# Patient Record
Sex: Male | Born: 1950 | Race: White | Hispanic: No | Marital: Married | Smoking: Never smoker
Health system: Southern US, Community
[De-identification: ages and names within clinical notes are randomized; demographics above are authoritative.]

## PROBLEM LIST (undated history)

## (undated) DIAGNOSIS — I10 Essential (primary) hypertension: Secondary | ICD-10-CM

## (undated) DIAGNOSIS — N400 Enlarged prostate without lower urinary tract symptoms: Secondary | ICD-10-CM

## (undated) DIAGNOSIS — E785 Hyperlipidemia, unspecified: Secondary | ICD-10-CM

## (undated) HISTORY — PX: THYROID SURGERY: SHX805

---

## 2010-11-02 ENCOUNTER — Ambulatory Visit: Payer: Self-pay | Admitting: Gastroenterology

## 2015-08-26 ENCOUNTER — Other Ambulatory Visit: Payer: Self-pay | Admitting: Internal Medicine

## 2015-08-26 DIAGNOSIS — R221 Localized swelling, mass and lump, neck: Secondary | ICD-10-CM

## 2015-09-05 ENCOUNTER — Ambulatory Visit
Admission: RE | Admit: 2015-09-05 | Discharge: 2015-09-05 | Disposition: A | Discharge status: Home / Self Care | Payer: PPO | Source: Ambulatory Visit | Attending: Internal Medicine | Admitting: Internal Medicine

## 2015-09-05 DIAGNOSIS — R221 Localized swelling, mass and lump, neck: Secondary | ICD-10-CM | POA: Insufficient documentation

## 2015-09-05 DIAGNOSIS — I700 Atherosclerosis of aorta: Secondary | ICD-10-CM | POA: Diagnosis not present

## 2015-09-05 HISTORY — DX: Essential (primary) hypertension: I10

## 2015-09-05 MED ORDER — IOHEXOL 300 MG/ML  SOLN
75.0000 mL | Freq: Once | INTRAMUSCULAR | Status: AC | PRN
  Administered 2015-09-05: 75 mL via INTRAVENOUS

## 2015-09-24 DIAGNOSIS — Q892 Congenital malformations of other endocrine glands: Secondary | ICD-10-CM | POA: Diagnosis not present

## 2015-10-15 ENCOUNTER — Encounter
Admission: RE | Admit: 2015-10-15 | Discharge: 2015-10-15 | Disposition: A | Discharge status: Home / Self Care | Payer: PPO | Source: Ambulatory Visit | Attending: Unknown Physician Specialty | Admitting: Unknown Physician Specialty

## 2015-10-15 DIAGNOSIS — Z01810 Encounter for preprocedural cardiovascular examination: Secondary | ICD-10-CM | POA: Diagnosis not present

## 2015-10-15 DIAGNOSIS — I10 Essential (primary) hypertension: Secondary | ICD-10-CM | POA: Diagnosis not present

## 2015-10-15 DIAGNOSIS — Q892 Congenital malformations of other endocrine glands: Secondary | ICD-10-CM | POA: Diagnosis not present

## 2015-10-15 HISTORY — DX: Hyperlipidemia, unspecified: E78.5

## 2015-10-15 HISTORY — DX: Benign prostatic hyperplasia without lower urinary tract symptoms: N40.0

## 2015-10-15 NOTE — Patient Instructions (Signed)
  Your procedure is scheduled MW:[id number] 10/21/15 Report to Day Surgery. 2ND FLOOR MEDICAL MALL ENTRANCE To find out your arrival time please call [phone] between 1PM - 3PM on Monday 10/20/15.  Remember: Instructions that are not followed completely may result in serious medical risk, up to and including death, or upon the discretion of your surgeon and anesthesiologist your surgery may need to be rescheduled.    __X__ 1. Do not eat food or drink liquids after midnight. No gum chewing or hard candies.     __X__ 2. No Alcohol for 24 hours before or after surgery.   ____ 3. Bring all medications with you on the day of surgery if instructed.    ___X_ 4. Notify your doctor if there is any change in your medical condition     (cold, fever, infections).     Do not wear jewelry, make-up, hairpins, clips or nail polish.  Do not wear lotions, powders, or perfumes. You may wear deodorant.  Do not shave 48 hours prior to surgery. Men may shave face and neck.  Do not bring valuables to the hospital.    [facility] is not responsible for any belongings or valuables.               Contacts, dentures or bridgework may not be worn into surgery.  Leave your suitcase in the car. After surgery it may be brought to your room.  For patients admitted to the hospital, discharge time is determined by your                treatment team.   Patients discharged the day of surgery will not be allowed to drive home.   Please read over the following fact sheets that you were given:   Surgical Site Infection Prevention   __X__ Take these medicines the morning of surgery with A SIP OF WATER:    1. AMLODIPINE  2.   3.   4.  5.  6.  ____ Fleet Enema (as directed)   __X__ Use CHG Soap as directed  ____ Use inhalers on the day of surgery  ____ Stop metformin 2 days prior to surgery    ____ Take 1/2 of usual insulin dose the night before surgery and none on the morning of surgery.   ____ Stop  Coumadin/Plavix/aspirin on   ____ Stop Anti-inflammatories on    ____ Stop supplements until after surgery.    ____ Bring C-Pap to the hospital.

## 2015-10-21 ENCOUNTER — Encounter
Admission: RE | Disposition: A | Discharge status: Home / Self Care | Payer: Self-pay | Source: Ambulatory Visit | Attending: Unknown Physician Specialty

## 2015-10-21 ENCOUNTER — Ambulatory Visit
Admission: RE | Admit: 2015-10-21 | Discharge: 2015-10-21 | Disposition: A | Discharge status: Home / Self Care | Payer: PPO | Source: Ambulatory Visit | Attending: Unknown Physician Specialty | Admitting: Unknown Physician Specialty

## 2015-10-21 ENCOUNTER — Ambulatory Visit: Payer: PPO | Admitting: Certified Registered Nurse Anesthetist

## 2015-10-21 ENCOUNTER — Encounter: Payer: Self-pay | Admitting: *Deleted

## 2015-10-21 DIAGNOSIS — Z79899 Other long term (current) drug therapy: Secondary | ICD-10-CM | POA: Insufficient documentation

## 2015-10-21 DIAGNOSIS — E785 Hyperlipidemia, unspecified: Secondary | ICD-10-CM | POA: Insufficient documentation

## 2015-10-21 DIAGNOSIS — N400 Enlarged prostate without lower urinary tract symptoms: Secondary | ICD-10-CM | POA: Insufficient documentation

## 2015-10-21 DIAGNOSIS — Q892 Congenital malformations of other endocrine glands: Secondary | ICD-10-CM | POA: Diagnosis not present

## 2015-10-21 DIAGNOSIS — Z833 Family history of diabetes mellitus: Secondary | ICD-10-CM | POA: Diagnosis not present

## 2015-10-21 HISTORY — PX: THYROGLOSSAL DUCT CYST: SHX297

## 2015-10-21 SURGERY — EXCISION, THYROGLOSSAL DUCT CYST
Anesthesia: General | Wound class: Clean

## 2015-10-21 MED ORDER — FAMOTIDINE 20 MG PO TABS
20.0000 mg | ORAL_TABLET | Freq: Once | ORAL | Status: AC
  Administered 2015-10-21: 20 mg via ORAL

## 2015-10-21 MED ORDER — FENTANYL CITRATE (PF) 100 MCG/2ML IJ SOLN
25.0000 ug | INTRAMUSCULAR | Status: DC | PRN
  Administered 2015-10-21 (×2): 50 ug via INTRAVENOUS

## 2015-10-21 MED ORDER — HYDROCODONE-ACETAMINOPHEN 5-325 MG PO TABS
1.0000 | ORAL_TABLET | ORAL | Status: DC | PRN
  Administered 2015-10-21 (×2): 1 via ORAL

## 2015-10-21 MED ORDER — LACTATED RINGERS IV SOLN
INTRAVENOUS | Status: DC
  Administered 2015-10-21: 07:00:00 via INTRAVENOUS

## 2015-10-21 MED ORDER — EPHEDRINE SULFATE 50 MG/ML IJ SOLN
INTRAMUSCULAR | Status: DC | PRN
  Administered 2015-10-21 (×3): 5 mg via INTRAVENOUS

## 2015-10-21 MED ORDER — HYDROCODONE-ACETAMINOPHEN 5-325 MG PO TABS
ORAL_TABLET | ORAL | Status: AC
  Filled 2015-10-21: qty 2

## 2015-10-21 MED ORDER — LIDOCAINE-EPINEPHRINE 1 %-1:100000 IJ SOLN
INTRAMUSCULAR | Status: DC | PRN
  Administered 2015-10-21: 6 mL

## 2015-10-21 MED ORDER — FAMOTIDINE 20 MG PO TABS
ORAL_TABLET | ORAL | Status: AC
  Administered 2015-10-21: 20 mg via ORAL
  Filled 2015-10-21: qty 1

## 2015-10-21 MED ORDER — PHENYLEPHRINE HCL 10 MG/ML IJ SOLN
INTRAMUSCULAR | Status: DC | PRN
  Administered 2015-10-21 (×3): 50 ug via INTRAVENOUS

## 2015-10-21 MED ORDER — ONDANSETRON HCL 4 MG/2ML IJ SOLN
INTRAMUSCULAR | Status: DC | PRN
  Administered 2015-10-21: 4 mg via INTRAVENOUS

## 2015-10-21 MED ORDER — DEXAMETHASONE SODIUM PHOSPHATE 10 MG/ML IJ SOLN
INTRAMUSCULAR | Status: DC | PRN
  Administered 2015-10-21: 8 mg via INTRAVENOUS

## 2015-10-21 MED ORDER — LIDOCAINE-EPINEPHRINE 1 %-1:100000 IJ SOLN
INTRAMUSCULAR | Status: AC
  Filled 2015-10-21: qty 1

## 2015-10-21 MED ORDER — SUGAMMADEX SODIUM 200 MG/2ML IV SOLN
INTRAVENOUS | Status: DC | PRN
  Administered 2015-10-21: 140 mg via INTRAVENOUS

## 2015-10-21 MED ORDER — ONDANSETRON HCL 4 MG/2ML IJ SOLN: INTRAMUSCULAR | Status: DC | PRN

## 2015-10-21 MED ORDER — ONDANSETRON HCL 4 MG/2ML IJ SOLN
INTRAMUSCULAR | Status: AC
  Filled 2015-10-21: qty 2

## 2015-10-21 MED ORDER — PROPOFOL 10 MG/ML IV BOLUS
INTRAVENOUS | Status: DC | PRN
  Administered 2015-10-21: 160 mg via INTRAVENOUS

## 2015-10-21 MED ORDER — BISOPROLOL FUMARATE 10 MG PO TABS
10.0000 mg | ORAL_TABLET | Freq: Once | ORAL | Status: AC
  Administered 2015-10-21: 10 mg via ORAL
  Filled 2015-10-21: qty 1

## 2015-10-21 MED ORDER — ROCURONIUM BROMIDE 100 MG/10ML IV SOLN
INTRAVENOUS | Status: DC | PRN
  Administered 2015-10-21 (×3): 10 mg via INTRAVENOUS
  Administered 2015-10-21: 5 mg via INTRAVENOUS

## 2015-10-21 MED ORDER — LIDOCAINE HCL (CARDIAC) 20 MG/ML IV SOLN
INTRAVENOUS | Status: DC | PRN
  Administered 2015-10-21: 60 mg via INTRAVENOUS

## 2015-10-21 MED ORDER — HYDROCODONE-ACETAMINOPHEN 5-300 MG PO TABS: 1.0000 | ORAL_TABLET | ORAL | Status: AC | PRN

## 2015-10-21 MED ORDER — CEPHALEXIN 500 MG PO CAPS: 500.0000 mg | ORAL_CAPSULE | Freq: Two times a day (BID) | ORAL | Status: AC

## 2015-10-21 MED ORDER — MIDAZOLAM HCL 2 MG/2ML IJ SOLN
INTRAMUSCULAR | Status: DC | PRN
  Administered 2015-10-21: 2 mg via INTRAVENOUS

## 2015-10-21 MED ORDER — FENTANYL CITRATE (PF) 100 MCG/2ML IJ SOLN
INTRAMUSCULAR | Status: AC
  Filled 2015-10-21: qty 2

## 2015-10-21 MED ORDER — ONDANSETRON HCL 4 MG/2ML IJ SOLN
4.0000 mg | Freq: Once | INTRAMUSCULAR | Status: AC | PRN
  Administered 2015-10-21: 4 mg via INTRAVENOUS

## 2015-10-21 MED ORDER — SUCCINYLCHOLINE CHLORIDE 20 MG/ML IJ SOLN
INTRAMUSCULAR | Status: DC | PRN
  Administered 2015-10-21: 120 mg via INTRAVENOUS

## 2015-10-21 MED ORDER — FENTANYL CITRATE (PF) 100 MCG/2ML IJ SOLN
INTRAMUSCULAR | Status: DC | PRN
  Administered 2015-10-21 (×2): 50 ug via INTRAVENOUS
  Administered 2015-10-21: 25 ug via INTRAVENOUS

## 2015-10-21 SURGICAL SUPPLY — 46 items
APPLIER CLIP 11 MED OPEN (CLIP)
APPLIER CLIP 9.375 SM OPEN (CLIP)
BLADE SURG 15 STRL LF DISP TIS (BLADE) ×1 IMPLANT
BLADE SURG 15 STRL SS (BLADE) ×2
CANISTER SUCT 1200ML W/VALVE (MISCELLANEOUS) ×3 IMPLANT
CLIP APPLIE 11 MED OPEN (CLIP) IMPLANT
CLIP APPLIE 9.375 SM OPEN (CLIP) IMPLANT
CORD BIP STRL DISP 12FT (MISCELLANEOUS) ×3 IMPLANT
DRAIN TLS ROUND 10FR (DRAIN) ×3 IMPLANT
DRAPE MAG INST 16X20 L/F (DRAPES) ×3 IMPLANT
ELECT CAUTERY BLADE TIP 2.5 (TIP) ×3
ELECT REM PT RETURN 9FT ADLT (ELECTROSURGICAL) ×3
ELECTRODE CAUTERY BLDE TIP 2.5 (TIP) ×1 IMPLANT
ELECTRODE REM PT RTRN 9FT ADLT (ELECTROSURGICAL) ×1 IMPLANT
FORCEPS JEWEL BIP 4-3/4 STR (INSTRUMENTS) ×3 IMPLANT
GLOVE BIO SURGEON STRL SZ7.5 (GLOVE) ×12 IMPLANT
GLOVE EXAM NITRILE PF SM BLUE (GLOVE) ×3 IMPLANT
GLOVE INDICATOR 7.0 STRL GRN (GLOVE) ×3 IMPLANT
GOWN STRL REUS W/ TWL LRG LVL3 (GOWN DISPOSABLE) ×4 IMPLANT
GOWN STRL REUS W/TWL LRG LVL3 (GOWN DISPOSABLE) ×8
HARMONIC SCALPEL FOCUS (MISCELLANEOUS) ×3 IMPLANT
HEMOSTAT SURGICEL 2X3 (HEMOSTASIS) ×3 IMPLANT
JACKSON PRATT 7MM (INSTRUMENTS) IMPLANT
LABEL OR SOLS (LABEL) ×3 IMPLANT
LIQUID BAND (GAUZE/BANDAGES/DRESSINGS) ×3 IMPLANT
NS IRRIG 500ML POUR BTL (IV SOLUTION) ×3 IMPLANT
PACK HEAD/NECK (MISCELLANEOUS) ×3 IMPLANT
RETRACTOR STAY HOOK 5MM (MISCELLANEOUS) ×3 IMPLANT
SPONGE KITTNER 5P (MISCELLANEOUS) ×3 IMPLANT
SPONGE XRAY 4X4 16PLY STRL (MISCELLANEOUS) ×3 IMPLANT
STAPLER SKIN PROX 35W (STAPLE) ×3 IMPLANT
SUCTION FRAZIER HANDLE 10FR (MISCELLANEOUS) ×2
SUCTION TUBE FRAZIER 10FR DISP (MISCELLANEOUS) ×1 IMPLANT
SUT ETHILON 3-0 FS-10 30 BLK (SUTURE)
SUT SILK 0 (SUTURE)
SUT SILK 0 30XBRD TIE 6 (SUTURE) IMPLANT
SUT SILK 2 0 (SUTURE)
SUT SILK 2 0 SH (SUTURE) ×3 IMPLANT
SUT SILK 2-0 30XBRD TIE 12 (SUTURE) IMPLANT
SUT SILK 3 0 REEL (SUTURE) IMPLANT
SUT SILK 4 0 (SUTURE)
SUT SILK 4-0 18XBRD TIE 12 (SUTURE) IMPLANT
SUT VIC AB 3-0 SH 27 (SUTURE)
SUT VIC AB 3-0 SH 27X BRD (SUTURE) IMPLANT
SUT VIC AB 4-0 RB1 18 (SUTURE) ×3 IMPLANT
SUTURE EHLN 3-0 FS-10 30 BLK (SUTURE) IMPLANT

## 2015-10-21 NOTE — Anesthesia Procedure Notes (Signed)
Procedure Name: Intubation Performed by: Malva Cogan Pre-anesthesia Checklist: Patient identified, Patient being monitored, Timeout performed, Emergency Drugs available and Suction available Patient Re-evaluated:Patient Re-evaluated prior to inductionOxygen Delivery Method: Circle system utilized Preoxygenation: Pre-oxygenation with 100% oxygen Intubation Type: IV induction Ventilation: Mask ventilation without difficulty Laryngoscope Size: Mac and 3 Grade View: Grade II Tube type: Oral Tube size: 7.5 mm Number of attempts: 1 Airway Equipment and Method: Stylet Placement Confirmation: ETT inserted through vocal cords under direct vision,  positive ETCO2 and breath sounds checked- equal and bilateral Secured at: 23 cm Tube secured with: Tape Dental Injury: Teeth and Oropharynx as per pre-operative assessment

## 2015-10-21 NOTE — Progress Notes (Signed)
Arouses to name. Oral airway d/c'd. Spontaneous resp. Swallowing without difficulty. C/O sorethroat. Ice chip given. Tolerated well.

## 2015-10-21 NOTE — H&P (Signed)
  H+P  Reviewed and will be scanned in later. No changes noted. 

## 2015-10-21 NOTE — Discharge Instructions (Signed)
AMBULATORY SURGERY  °DISCHARGE INSTRUCTIONS ° ° °1) The drugs that you were given will stay in your system until tomorrow so for the next 24 hours you should not: ° °A) Drive an automobile °B) Make any legal decisions °C) Drink any alcoholic beverage ° ° °2) You may resume regular meals tomorrow.  Today it is better to start with liquids and gradually work up to solid foods. ° °You may eat anything you prefer, but it is better to start with liquids, then soup and crackers, and gradually work up to solid foods. ° ° °3) Please notify your doctor immediately if you have any unusual bleeding, trouble breathing, redness and pain at the surgery site, drainage, fever, or pain not relieved by medication. ° ° ° °4) Additional Instructions: ° ° ° ° ° ° ° °Please contact your physician with any problems or Same Day Surgery at [phone], Monday through Friday 6 am to 4 pm, or [place] at [place] Main number at [phone]. °

## 2015-10-21 NOTE — Op Note (Signed)
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 After general endotracheal anesthesia the neck gently extended is obvious midline cystic mass. Incision line was marked over the cystic mass a local anesthetic of 1% lidocaine with 1 100,000 units of epinephrine was used to inject overlying the mass. A total of 4 cc was used. The neck was then prepped and draped sterilely a 15 blade was used to incise down to and through the platysma muscle. Hemostasis was achieved using the Bovie cautery supra and infra subplatysmal flaps were elevated. Careful dissection was taken down to the cystic mass dissection first proceeded inferiorly down to the isthmus of the thyroid gland. As a cystic area approach the isthmus this was divided using the Harmonic scalpel. The cystic mass was then peeled superiorly off of the cricoid cartilage superiorly off of the thyroid cartilage into the supra notch region. The lateral aspects of the Long were gently dissected and care was taken not to enter the cystic mass. A small cuff of muscle was taken as well laterally to prevent entering the Long the Long was then traced superiorly as it was adherent to the hyoid bone the suprahyoid and infrahyoid musculature was gently released from the hyoid bone using the microbipolar and the monitor scalpel. The bone nippers were then used to excise the midportion of the hyoid bone. With the bone divided on each side the Long was then traced superiorly over the hyoid bone was another cystic  component which measured approximate 1/2 cm superior to the hyoid the Long tract was traced superiorly as it appeared to enter the base of the tongue. Small strands of muscle were dissected free superiorly. As a Long tract appeared to enter the tongue base a clamp was placed at the tongue base over this Long tract and the Long was divided using Harmonic scalpel. This removed the entire specimen including the bilobed cystic component as well as the midportion of the hyoid bone. A 2-0 silk suture ligature was placed around the clamp at the tongue base dull and sutured tightly. The clamp was removed the wound was copiously irrigated with saline and small edges of the hyoid bone which were oozing were cauterized using the Bovie. The wounds and cups irrigated saline again with no active bleeding Surgicel was placed within the wound and #10 TLS drain was brought out inferiorly. The strap muscles were reapproximated in the midline using 4-0 Vicryl the platysma layer was closed using 4-0 Vicryl and the subcutaneous tissues were closed using 4-0 Vicryl the skin was closed using Dermabond. The patient was in return anesthesia where he was awakened in the operating room and taken recovery room in stable condition  Cultures: None  Specimens: Thyroglossal duct Long  Dispo:   Good  Plan:  He will be discharged home with pain medication and by mouth antibiotics as well as instructions on how to change the drain suction apparatus follow-up 1 day for drain removal  Billy Long  10/21/2015 8:53  AM

## 2015-10-21 NOTE — Transfer of Care (Signed)
Immediate Anesthesia Transfer of Care Note  Patient: Billy Long  Procedure(s) Performed: Procedure(s): THYROGLOSSAL DUCT CYST EXCISION (N/A)  Patient Location: PACU  Anesthesia Type:General  Level of Consciousness: sedated  Airway & Oxygen Therapy: Patient Spontanous Breathing and Patient connected to face mask oxygen  Post-op Assessment: Report given to RN and Post -op Vital signs reviewed and stable  Post vital signs: Reviewed and stable  Last Vitals:  Filed Vitals:   10/21/15 0642  BP: 142/72  Pulse: 64  Temp: 35.7 C  Resp: 16    Complications: No apparent anesthesia complications

## 2015-10-21 NOTE — Anesthesia Preprocedure Evaluation (Signed)
Anesthesia Evaluation  Patient identified by MRN, date of birth, ID band Patient awake    Reviewed: Allergy & Precautions, H&P , NPO status , Patient's Chart, lab work & pertinent test results, reviewed documented beta blocker date and time   Airway Mallampati: III  TM Distance: >3 FB Neck ROM: full    Dental no notable dental hx. (+) Caps, Teeth Intact   Pulmonary neg pulmonary ROS,    Pulmonary exam normal breath sounds clear to auscultation       Cardiovascular Exercise Tolerance: Good hypertension, (-) angina(-) CAD, (-) Past MI, (-) Cardiac Stents and (-) CABG Normal cardiovascular exam(-) dysrhythmias (-) Valvular Problems/Murmurs Rhythm:regular Rate:Normal     Neuro/Psych negative neurological ROS  negative psych ROS   GI/Hepatic negative GI ROS, Neg liver ROS,   Endo/Other  negative endocrine ROS  Renal/GU negative Renal ROS  negative genitourinary   Musculoskeletal   Abdominal   Peds  Hematology negative hematology ROS (+)   Anesthesia Other Findings Past Medical History:   Hypertension                                                 Hyperlipemia                                                 BPH (benign prostatic hypertrophy)                           Reproductive/Obstetrics negative OB ROS                             Anesthesia Physical Anesthesia Plan  ASA: II  Anesthesia Plan: General   Post-op Pain Management:    Induction:   Airway Management Planned:   Additional Equipment:   Intra-op Plan:   Post-operative Plan:   Informed Consent: I have reviewed the patients History and Physical, chart, labs and discussed the procedure including the risks, benefits and alternatives for the proposed anesthesia with the patient or authorized representative who has indicated his/her understanding and acceptance.   Dental Advisory Given  Plan Discussed with:  Anesthesiologist, CRNA and Surgeon  Anesthesia Plan Comments:         Anesthesia Quick Evaluation

## 2015-10-21 NOTE — Anesthesia Postprocedure Evaluation (Signed)
Anesthesia Post Note  Patient: Billy Long  Procedure(s) Performed: Procedure(s) (LRB): THYROGLOSSAL DUCT CYST EXCISION (N/A)  Patient location during evaluation: PACU Anesthesia Type: General Level of consciousness: awake and alert Pain management: pain level controlled Vital Signs Assessment: post-procedure vital signs reviewed and stable Respiratory status: spontaneous breathing, nonlabored ventilation, respiratory function stable and patient connected to nasal cannula oxygen Cardiovascular status: blood pressure returned to baseline and stable Postop Assessment: no signs of nausea or vomiting Anesthetic complications: no    Last Vitals:  Filed Vitals:   10/21/15 0950 10/21/15 1131  BP: 134/85 133/86  Pulse: 80 82  Temp: 36.5 C 36.3 C  Resp: 16 18    Last Pain:  Filed Vitals:   10/21/15 1140  PainSc: 4                  Lenard Simmer

## 2015-10-22 ENCOUNTER — Encounter: Payer: Self-pay | Admitting: Unknown Physician Specialty

## 2015-10-22 LAB — SURGICAL PATHOLOGY

## 2015-11-10 DIAGNOSIS — I10 Essential (primary) hypertension: Secondary | ICD-10-CM | POA: Diagnosis not present

## 2015-11-10 DIAGNOSIS — E7800 Pure hypercholesterolemia, unspecified: Secondary | ICD-10-CM | POA: Diagnosis not present

## 2015-11-18 DIAGNOSIS — N401 Enlarged prostate with lower urinary tract symptoms: Secondary | ICD-10-CM | POA: Diagnosis not present

## 2015-11-18 DIAGNOSIS — N138 Other obstructive and reflux uropathy: Secondary | ICD-10-CM | POA: Diagnosis not present

## 2015-11-18 DIAGNOSIS — I10 Essential (primary) hypertension: Secondary | ICD-10-CM | POA: Diagnosis not present

## 2015-11-18 DIAGNOSIS — E7800 Pure hypercholesterolemia, unspecified: Secondary | ICD-10-CM | POA: Diagnosis not present

## 2016-05-19 DIAGNOSIS — E7800 Pure hypercholesterolemia, unspecified: Secondary | ICD-10-CM | POA: Diagnosis not present

## 2016-05-26 DIAGNOSIS — Z125 Encounter for screening for malignant neoplasm of prostate: Secondary | ICD-10-CM | POA: Diagnosis not present

## 2016-05-26 DIAGNOSIS — N401 Enlarged prostate with lower urinary tract symptoms: Secondary | ICD-10-CM | POA: Diagnosis not present

## 2016-05-26 DIAGNOSIS — I10 Essential (primary) hypertension: Secondary | ICD-10-CM | POA: Diagnosis not present

## 2016-05-26 DIAGNOSIS — E7800 Pure hypercholesterolemia, unspecified: Secondary | ICD-10-CM | POA: Diagnosis not present

## 2016-05-26 DIAGNOSIS — Z0000 Encounter for general adult medical examination without abnormal findings: Secondary | ICD-10-CM | POA: Diagnosis not present

## 2016-05-26 DIAGNOSIS — N138 Other obstructive and reflux uropathy: Secondary | ICD-10-CM | POA: Diagnosis not present

## 2016-07-20 DIAGNOSIS — H2513 Age-related nuclear cataract, bilateral: Secondary | ICD-10-CM | POA: Diagnosis not present

## 2016-11-16 DIAGNOSIS — Z125 Encounter for screening for malignant neoplasm of prostate: Secondary | ICD-10-CM | POA: Diagnosis not present

## 2016-11-16 DIAGNOSIS — E7800 Pure hypercholesterolemia, unspecified: Secondary | ICD-10-CM | POA: Diagnosis not present

## 2016-11-16 DIAGNOSIS — I10 Essential (primary) hypertension: Secondary | ICD-10-CM | POA: Diagnosis not present

## 2016-11-23 DIAGNOSIS — I10 Essential (primary) hypertension: Secondary | ICD-10-CM | POA: Diagnosis not present

## 2016-11-23 DIAGNOSIS — N401 Enlarged prostate with lower urinary tract symptoms: Secondary | ICD-10-CM | POA: Diagnosis not present

## 2016-11-23 DIAGNOSIS — E7800 Pure hypercholesterolemia, unspecified: Secondary | ICD-10-CM | POA: Diagnosis not present

## 2016-11-23 DIAGNOSIS — N138 Other obstructive and reflux uropathy: Secondary | ICD-10-CM | POA: Diagnosis not present

## 2017-06-04 IMAGING — CT CT NECK W/ CM
2 of 3 series · 8 of 14 positions shown, 9 images · IV contrast (omnipaque)
Comparison: None.

CLINICAL DATA: 64-year-old male with acute palpable non painful
mass discovered under the chain in 7 days ago, increased in size
rapidly and has mildly decreased recently. Initial encounter.

EXAM:
CT NECK WITH CONTRAST
TECHNIQUE: Multidetector CT imaging of the neck was performed using the
standard protocol following the bolus administration of intravenous
contrast.
CONTRAST:  75mL OMNIPAQUE IOHEXOL 300 MG/ML  SOLN

[Series 2: axial neck · axial · 0.52mm/px · z∈[-387,-239]mm · 4 of 124 slices shown]
[im 25/124  bone]
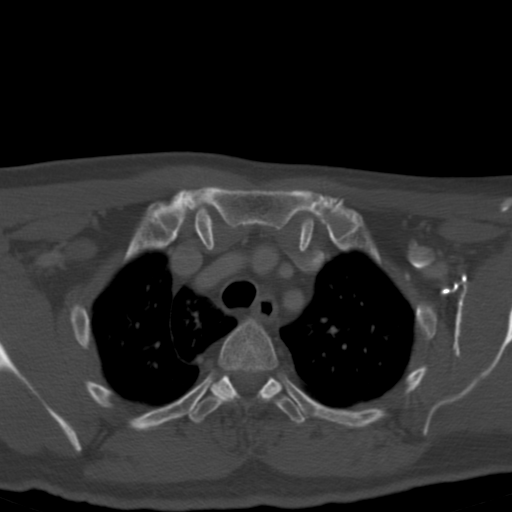
[im 50/124  bone]
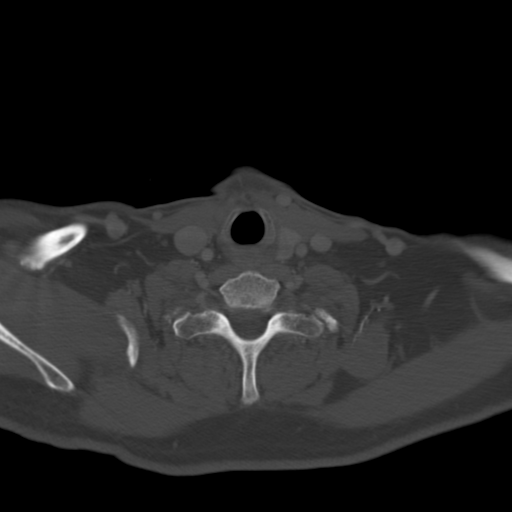
[im 74/124  bone]
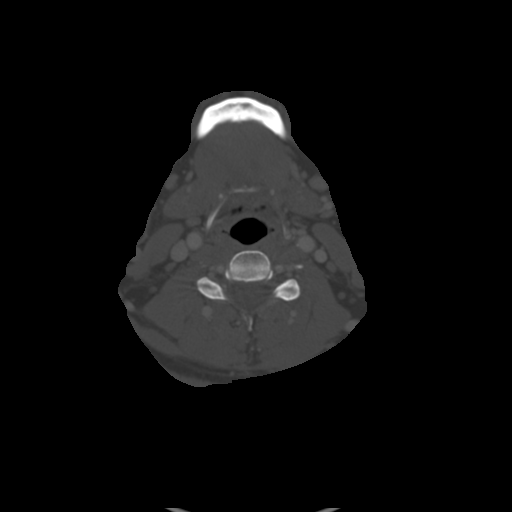
[im 99/124  bone]
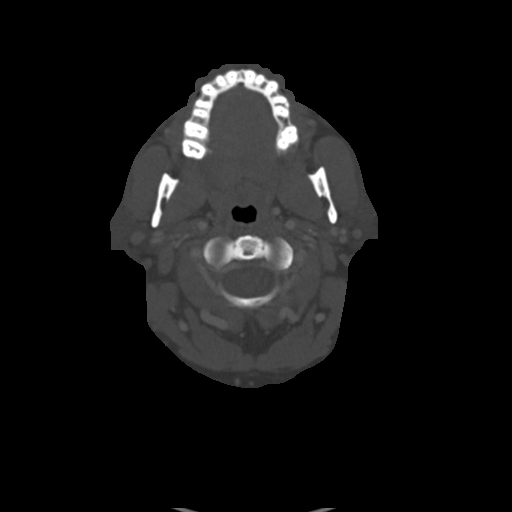

[Series 7: ax oropharynx · axial · 0.42mm/px · z∈[-434,-261]mm · 4 of 153 slices shown, 5 images]
[im 31/153  soft-tissue]
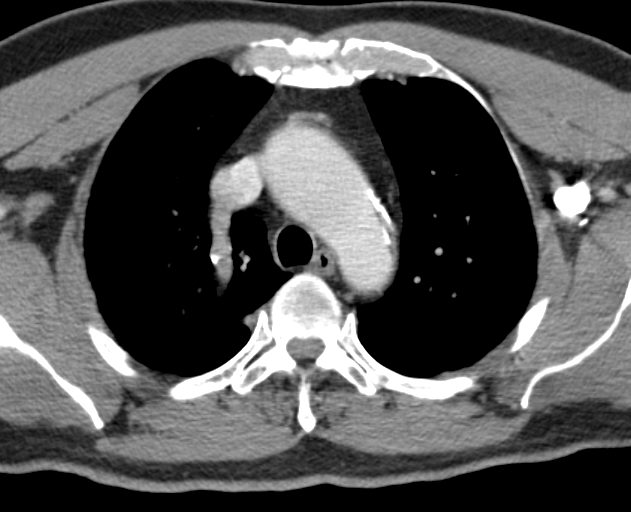
[im 31/153  bone]
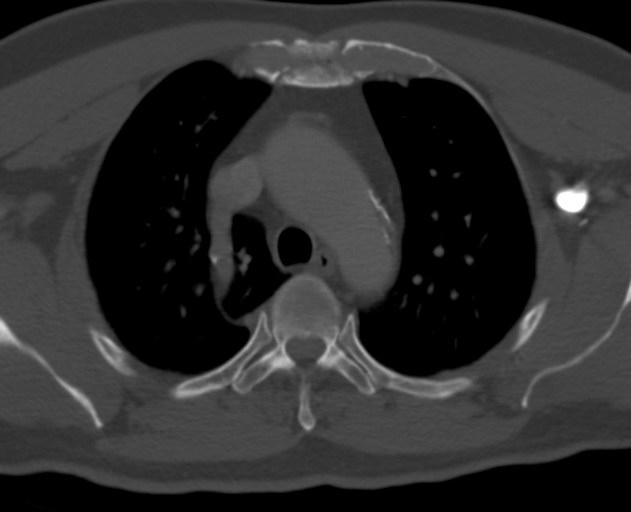
[im 61/153  bone]
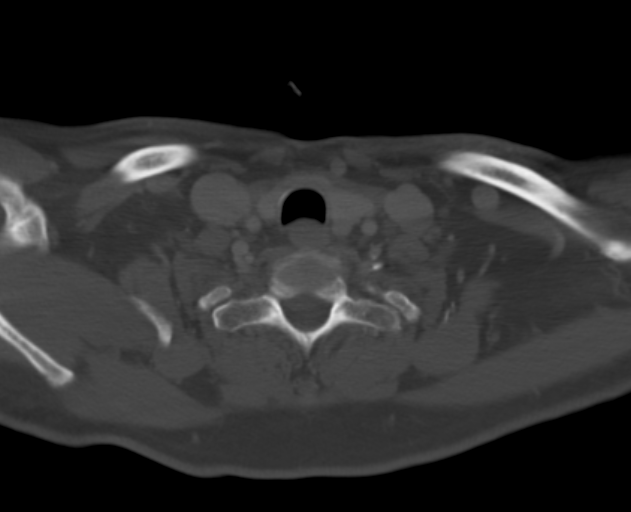
[im 92/153  bone]
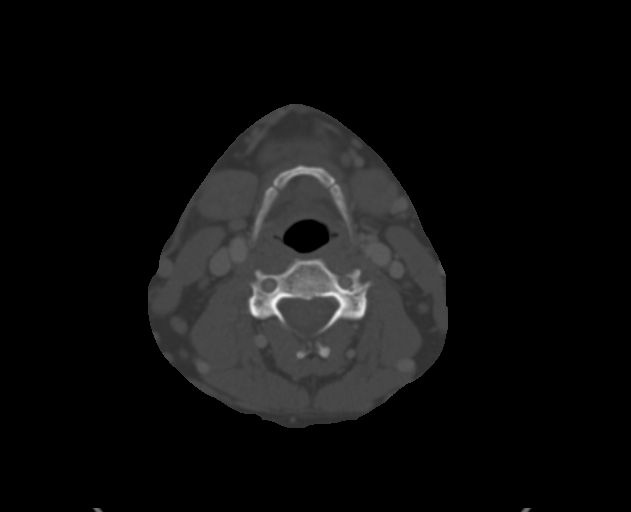
[im 122/153  bone]
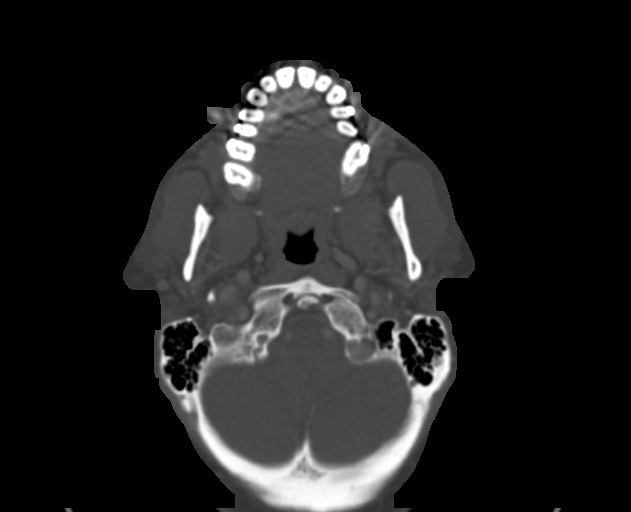

[8 of 14 positions shown; findings below may reference images not displayed]

FINDINGS: Pharynx and larynx: Larynx within normal limits. Pharyngeal contours
are normal. Negative parapharyngeal and retropharyngeal spaces.

Salivary glands: Negative submandibular glands and parotid glands.
Negative sublingual space.

Thyroid: Orthotopic thyroid parenchyma appears normal, the right
lobe is diminutive.

The palpable area of concern is marked in the midline just above the
thyroid gland corresponding to the level of the thyroid cartilage on
series 2, image 72. Best seen on sagittal images the trans spatial
lesion corresponds to a Z-shaped fluid density structure which is
intimately associated with the hyoid bone (note anterior superior
projection on series 2, image 53 and sagittal image 64), an the
thyroid cartilage occupying some of the pre epiglottic space, and
intimately associated with the strap muscles at the level of the
thyroid cartilage. All told the lesion encompasses 30 x 30 x 43 mm
(AP by transverse by CC) and is most compatible with an elongated
thyroglossal duct cyst.

Lymph nodes: No cervical lymphadenopathy. Mildly asymmetric left
level 4 low-density soft tissue (series 2, image 79) is probably
related to the thoracic duct.

Vascular: Major vascular structures in the neck and at the skullbase
are patent. Minimal carotid atherosclerosis. However, there is
prominent mural soft plaque and calcified along the lateral aortic
arch (series 2 images 106 through 4041), and the distal ascending
aorta appears ectatic measuring 38-40 mm.

Limited intracranial: Negative.

Visualized orbits: Negative.

Mastoids and visualized paranasal sinuses: Visualized paranasal
sinuses and mastoids are clear.

Skeleton: No acute osseous abnormality identified. Mild for age
osseous degeneration in the cervical spine.

Upper chest: Incidental azygos fissure. No superior mediastinal
lymphadenopathy. Mild respiratory motion artifact in the lungs which
otherwise appear negative.
IMPRESSION: 1. Trans spatial Z-shaped cystic mass corresponding to the palpable
area of concern most compatible with elongated thyroglossal duct
cyst. 30 x 30 x 43 mm. This may have been recently infected given
the reported acute alterations in size.
2. Otherwise negative neck CT.
3. Ectasia of the distal ascending aorta, 38-40 mm. Soft and
calcified aortic atherosclerosis. Recommend annual imaging followup
by Chest CTA or MRA. This recommendation follows 8303
ACCF/AHA/AATS/ACR/ASA/SCA/KLEVER/RABABALELA/TIGER/NDEVASHEKA Guidelines for the
Diagnosis and Management of Patients with Thoracic Aortic Disease.
Circulation. 8303; 121: [id number]

## 2019-05-08 DEATH — deceased
